# Patient Record
Sex: Female | Born: 1960 | Race: White | Hispanic: No | Marital: Married
Health system: Southern US, Community
[De-identification: ages and names within clinical notes are randomized; demographics above are authoritative.]

## PROBLEM LIST (undated history)

## (undated) HISTORY — PX: BREAST EXCISIONAL BIOPSY: SUR124

---

## 1999-02-15 ENCOUNTER — Other Ambulatory Visit: Admission: RE | Admit: 1999-02-15 | Discharge: 1999-02-15 | Payer: Self-pay | Admitting: Obstetrics and Gynecology

## 1999-09-30 ENCOUNTER — Encounter: Payer: Self-pay | Admitting: Obstetrics and Gynecology

## 1999-09-30 ENCOUNTER — Encounter: Admission: RE | Admit: 1999-09-30 | Discharge: 1999-09-30 | Payer: Self-pay | Admitting: Obstetrics and Gynecology

## 2000-03-16 ENCOUNTER — Other Ambulatory Visit: Admission: RE | Admit: 2000-03-16 | Discharge: 2000-03-16 | Payer: Self-pay | Admitting: Obstetrics and Gynecology

## 2000-10-17 ENCOUNTER — Encounter: Payer: Self-pay | Admitting: Obstetrics and Gynecology

## 2000-10-17 ENCOUNTER — Encounter: Admission: RE | Admit: 2000-10-17 | Discharge: 2000-10-17 | Payer: Self-pay | Admitting: Obstetrics and Gynecology

## 2001-04-24 ENCOUNTER — Other Ambulatory Visit: Admission: RE | Admit: 2001-04-24 | Discharge: 2001-04-24 | Payer: Self-pay | Admitting: Obstetrics and Gynecology

## 2001-10-24 ENCOUNTER — Encounter: Admission: RE | Admit: 2001-10-24 | Discharge: 2001-10-24 | Payer: Self-pay | Admitting: Obstetrics and Gynecology

## 2001-10-24 ENCOUNTER — Encounter: Payer: Self-pay | Admitting: Obstetrics and Gynecology

## 2001-10-24 ENCOUNTER — Encounter: Payer: Self-pay | Admitting: Family Medicine

## 2001-10-24 ENCOUNTER — Encounter: Admission: RE | Admit: 2001-10-24 | Discharge: 2001-10-24 | Payer: Self-pay | Admitting: Family Medicine

## 2002-05-16 ENCOUNTER — Other Ambulatory Visit: Admission: RE | Admit: 2002-05-16 | Discharge: 2002-05-16 | Payer: Self-pay | Admitting: Obstetrics and Gynecology

## 2002-11-07 ENCOUNTER — Encounter: Admission: RE | Admit: 2002-11-07 | Discharge: 2002-11-07 | Payer: Self-pay | Admitting: Obstetrics and Gynecology

## 2002-11-07 ENCOUNTER — Encounter: Payer: Self-pay | Admitting: Obstetrics and Gynecology

## 2003-06-19 ENCOUNTER — Other Ambulatory Visit: Admission: RE | Admit: 2003-06-19 | Discharge: 2003-06-19 | Payer: Self-pay | Admitting: Obstetrics and Gynecology

## 2003-12-04 ENCOUNTER — Encounter: Admission: RE | Admit: 2003-12-04 | Discharge: 2003-12-04 | Payer: Self-pay | Admitting: Obstetrics and Gynecology

## 2004-02-16 ENCOUNTER — Encounter (INDEPENDENT_AMBULATORY_CARE_PROVIDER_SITE_OTHER): Payer: Self-pay | Admitting: *Deleted

## 2004-02-16 ENCOUNTER — Inpatient Hospital Stay (HOSPITAL_COMMUNITY): Admission: RE | Admit: 2004-02-16 | Discharge: 2004-02-18 | Payer: Self-pay | Admitting: Obstetrics and Gynecology

## 2004-12-16 ENCOUNTER — Encounter: Admission: RE | Admit: 2004-12-16 | Discharge: 2004-12-16 | Payer: Self-pay | Admitting: Obstetrics and Gynecology

## 2004-12-22 ENCOUNTER — Encounter: Admission: RE | Admit: 2004-12-22 | Discharge: 2004-12-22 | Payer: Self-pay | Admitting: Obstetrics and Gynecology

## 2005-05-12 ENCOUNTER — Other Ambulatory Visit: Admission: RE | Admit: 2005-05-12 | Discharge: 2005-05-12 | Payer: Self-pay | Admitting: Obstetrics and Gynecology

## 2006-01-05 ENCOUNTER — Encounter: Admission: RE | Admit: 2006-01-05 | Discharge: 2006-01-05 | Payer: Self-pay | Admitting: Obstetrics and Gynecology

## 2007-01-18 ENCOUNTER — Encounter: Admission: RE | Admit: 2007-01-18 | Discharge: 2007-01-18 | Payer: Self-pay | Admitting: Obstetrics and Gynecology

## 2008-02-07 ENCOUNTER — Encounter: Admission: RE | Admit: 2008-02-07 | Discharge: 2008-02-07 | Payer: Self-pay | Admitting: Obstetrics and Gynecology

## 2008-05-07 ENCOUNTER — Encounter: Admission: RE | Admit: 2008-05-07 | Discharge: 2008-05-07 | Payer: Self-pay | Admitting: Obstetrics and Gynecology

## 2009-02-08 ENCOUNTER — Encounter: Admission: RE | Admit: 2009-02-08 | Discharge: 2009-02-08 | Payer: Self-pay | Admitting: Obstetrics and Gynecology

## 2010-02-10 ENCOUNTER — Encounter: Admission: RE | Admit: 2010-02-10 | Discharge: 2010-02-10 | Payer: Self-pay | Admitting: Obstetrics and Gynecology

## 2010-07-24 ENCOUNTER — Encounter: Payer: Self-pay | Admitting: Obstetrics and Gynecology

## 2010-11-18 NOTE — Discharge Summary (Signed)
NAME:  Regina Yu, DORMAN                        ACCOUNT NO.:  [id number]   MEDICAL RECORD NO.:  [id number]                   PATIENT TYPE:  INP   LOCATION:  9313                                 FACILITY:  [facility]   PHYSICIAN:  Michelle L. Vincente Poli, M.D.            DATE OF BIRTH:  Jun 12, 1961   DATE OF ADMISSION:  02/16/2004  DATE OF DISCHARGE:  02/18/2004                                 DISCHARGE SUMMARY   PREOPERATIVE DIAGNOSIS:  Pelvic relaxation.   POSTOPERATIVE DIAGNOSIS:  Pelvic relaxation.   HOSPITAL COURSE:  The patient is a 50 year old female who is admitted with  uterine prolapse, cystocele, and rectocele which are symptomatic.  On the  day of admission she undergoes a TVH, A&P repair.  By postoperative day #1  she was doing very well.  She had some discomfort and some pressure and  hemoglobin was 10.7 and white blood cell count was 12.6.  She was advanced  to a regular diet.  She was ambulating and voiding.  She was also given sitz  baths for local pain control and swelling.  By postoperative day #2 she was  doing well enough to go home.  She was in good condition.  She was  discharged home on ibuprofen and Tylox.  She had already had a bowel  movement.  She will follow up in 1 week in the office for a checkup and was  advised no intercourse for 6 weeks.  Advised to call if she has any fever,  temperature greater than 100.5, vaginal bleeding, nausea, vomiting, or  abdominal pain.  She was advised no driving for 1 week.                                               Michelle L. Vincente Poli, M.D.    Florestine Avers  D:  03/10/2004  T:  03/10/2004  Job:  [id number]

## 2010-11-18 NOTE — Op Note (Signed)
NAME:  Regina Yu, Regina Yu                        ACCOUNT NO.:  [id number]   MEDICAL RECORD NO.:  [id number]                   PATIENT TYPE:  INP   LOCATION:  9313                                 FACILITY:  [facility]   PHYSICIAN:  Michelle L. Vincente Poli, M.D.            DATE OF BIRTH:  [birth year]   DATE OF PROCEDURE:  02/16/2004  DATE OF DISCHARGE:                                 OPERATIVE REPORT   PREOPERATIVE DIAGNOSES:  1. Uterine prolapse.  2. Cystocele.  3. Rectocele.   POSTOPERATIVE DIAGNOSES:  1. Uterine prolapse.  2. Cystocele.  3. Rectocele.   OPERATION PERFORMED:  1. Total vaginal hysterectomy.  2. Anterior repair.  3. Posterior repair.   SURGEON:  Michelle L. Vincente Poli, M.D.   ASSISTANT:  Duke Salvia. Marcelle Overlie, M.D.   ANESTHESIA:  General.   ESTIMATED BLOOD LOSS:  100 mL.   FINDINGS:  There is a grade 2 to 3 cervical uterine prolapse and grade 2 to  3 cystocele and grade 2 to 3 rectocele.   DESCRIPTION OF PROCEDURE:  The patient was taken to the operating room.  She  was intubated without difficulty.  She was prepped and draped in the usual  sterile fashion and a Foley catheter was inserted into the bladder.  A  speculum was inserted into the vagina.  The cervix was grasped with a  tenaculum and a circumferential incision was made around the cervix.  The  posterior cul-de-sac was entered using Mayo scissors.  The anterior cul-de-  sac was then entered using Metzenbaum scissors.  We then used curved Heaney  clamps to clamp just the uterosacral cardinal ligament complexes on either  side being careful to stay just beside the cervix.  Each pedicle was cut and  suture ligated using 0 Vicryl suture.  Using curved Heaney clamp, we worked  our way up along just beside the uterus along the broad ligament. Each  pedicle was clamped, suture ligated using 0 Vicryl suture.  As we reached  the level of the triple pedicle, the uterus was retroflexed and curved  Heaney clamps were placed  across the triple pedicle.  We then noticed that  there was a Hulka clip on the patient's right side, which was removed as  well.  Each pedicle was cut and suture ligated using 0 Vicryl suture and  then further secured using a free tie of 0 Vicryl suture.  All pedicles were  inspected.  Hemostasis was noted. The posterior two thirds of the cuff was  closed in a continuous running locked stitch using __________ . We then  turned our attention anteriorly where Allis clamps were placed at each side  of the vaginal apex and using Metzenbaum scissors, we then dissected the  vesicovaginal fascia free from the overlying vaginal epithelium and made a  midline incision all the way up to the UV junction.  We then nicely reduced  the cystocele using sharp and blunt  dissection and it was further reduced  using pursestring sutures using 2-0 Vicryl suture times three. After the  cystocele was reduced, the redundant vaginal epithelium was trimmed and the  vagina was then closed using 2-0 Vicryl in continuous running locked stitch.  It was closed all the way to where the cuff closure was started.  Hemostasis  again was excellent.  We then turned our attention posteriorly where there  was a grade 2 to 3 rectocele noted.  It was all the way up to almost through  the posterior vaginal cuff.  We then placed Allis clamps at 5 and 7 o'clock  and a V-shaped incision was made in the perineum.  We then made a midline  incision in the vaginal epithelium all the way up the vaginal cuff and  dissected the rectovaginal fascia free from the overlying vaginal  epithelium.  The rectocele was reduced nicely and then it was closed using  interrupted 0 Vicryl sutures distally starting distally and moving  proximally.  We then trimmed the redundant vaginal epithelium and closed the  vaginal incision using 2-0 Vicryl in a continuous locking fashion.  The  perineum was also closed in subcuticular fashion using the same 2-0  Vicryl.  At the end of the procedure, hemostasis was excellent.  All sponge, lap, and  instruments were all correct times two.  The Foley catheter was draining  clear urine and a vaginal packing soaked in Estrace cream was placed in the  vagina.  The patient was extubated and went to recovery room in stable  condition.   PATHOLOGY:  Uterus and cervix.                                               Michelle L. Vincente Poli, M.D.    Florestine Avers  D:  02/16/2004  T:  02/17/2004  Job:  [id number]

## 2011-01-17 ENCOUNTER — Other Ambulatory Visit: Payer: Self-pay | Admitting: Obstetrics and Gynecology

## 2011-01-17 DIAGNOSIS — Z1231 Encounter for screening mammogram for malignant neoplasm of breast: Secondary | ICD-10-CM

## 2011-02-17 ENCOUNTER — Ambulatory Visit
Admission: RE | Admit: 2011-02-17 | Discharge: 2011-02-17 | Disposition: A | Discharge status: Home / Self Care | Payer: BC Managed Care – PPO | Source: Ambulatory Visit | Attending: Obstetrics and Gynecology | Admitting: Obstetrics and Gynecology

## 2011-02-17 DIAGNOSIS — Z1231 Encounter for screening mammogram for malignant neoplasm of breast: Secondary | ICD-10-CM

## 2012-01-09 ENCOUNTER — Other Ambulatory Visit: Payer: Self-pay | Admitting: Obstetrics and Gynecology

## 2012-01-09 DIAGNOSIS — Z1231 Encounter for screening mammogram for malignant neoplasm of breast: Secondary | ICD-10-CM

## 2012-01-09 DIAGNOSIS — M8580 Other specified disorders of bone density and structure, unspecified site: Secondary | ICD-10-CM

## 2012-01-23 ENCOUNTER — Other Ambulatory Visit: Payer: BC Managed Care – PPO

## 2012-02-23 ENCOUNTER — Ambulatory Visit
Admission: RE | Admit: 2012-02-23 | Discharge: 2012-02-23 | Disposition: A | Discharge status: Home / Self Care | Payer: BC Managed Care – PPO | Source: Ambulatory Visit | Attending: Obstetrics and Gynecology | Admitting: Obstetrics and Gynecology

## 2012-02-23 DIAGNOSIS — Z1231 Encounter for screening mammogram for malignant neoplasm of breast: Secondary | ICD-10-CM

## 2012-02-23 DIAGNOSIS — M8580 Other specified disorders of bone density and structure, unspecified site: Secondary | ICD-10-CM

## 2012-06-20 ENCOUNTER — Other Ambulatory Visit: Payer: Self-pay | Admitting: Obstetrics and Gynecology

## 2012-12-19 ENCOUNTER — Other Ambulatory Visit: Payer: Self-pay

## 2012-12-19 DIAGNOSIS — Z1231 Encounter for screening mammogram for malignant neoplasm of breast: Secondary | ICD-10-CM

## 2013-02-26 ENCOUNTER — Ambulatory Visit
Admission: RE | Admit: 2013-02-26 | Discharge: 2013-02-26 | Disposition: A | Discharge status: Home / Self Care | Payer: BC Managed Care – PPO | Source: Ambulatory Visit

## 2013-02-26 DIAGNOSIS — Z1231 Encounter for screening mammogram for malignant neoplasm of breast: Secondary | ICD-10-CM

## 2013-02-28 ENCOUNTER — Ambulatory Visit: Payer: BC Managed Care – PPO

## 2013-07-24 ENCOUNTER — Other Ambulatory Visit: Payer: Self-pay | Admitting: Obstetrics and Gynecology

## 2014-01-06 ENCOUNTER — Other Ambulatory Visit: Payer: Self-pay

## 2014-02-03 ENCOUNTER — Other Ambulatory Visit: Payer: Self-pay | Admitting: Obstetrics and Gynecology

## 2014-02-03 DIAGNOSIS — Z1231 Encounter for screening mammogram for malignant neoplasm of breast: Secondary | ICD-10-CM

## 2014-02-03 DIAGNOSIS — M8580 Other specified disorders of bone density and structure, unspecified site: Secondary | ICD-10-CM

## 2014-03-26 ENCOUNTER — Ambulatory Visit
Admission: RE | Admit: 2014-03-26 | Discharge: 2014-03-26 | Disposition: A | Discharge status: Home / Self Care | Payer: Commercial Managed Care - PPO | Source: Ambulatory Visit | Attending: Obstetrics and Gynecology | Admitting: Obstetrics and Gynecology

## 2014-03-26 ENCOUNTER — Encounter (INDEPENDENT_AMBULATORY_CARE_PROVIDER_SITE_OTHER): Payer: Self-pay

## 2014-03-26 DIAGNOSIS — Z1231 Encounter for screening mammogram for malignant neoplasm of breast: Secondary | ICD-10-CM

## 2014-03-26 DIAGNOSIS — M8580 Other specified disorders of bone density and structure, unspecified site: Secondary | ICD-10-CM

## 2014-11-05 ENCOUNTER — Other Ambulatory Visit (HOSPITAL_COMMUNITY): Payer: Self-pay | Admitting: Obstetrics and Gynecology

## 2014-11-06 LAB — CYTOLOGY - PAP

## 2014-11-26 ENCOUNTER — Other Ambulatory Visit: Payer: Self-pay | Admitting: Obstetrics and Gynecology

## 2015-03-01 ENCOUNTER — Other Ambulatory Visit: Payer: Self-pay

## 2015-03-01 DIAGNOSIS — Z1231 Encounter for screening mammogram for malignant neoplasm of breast: Secondary | ICD-10-CM

## 2015-04-08 ENCOUNTER — Ambulatory Visit
Admission: RE | Admit: 2015-04-08 | Discharge: 2015-04-08 | Disposition: A | Discharge status: Home / Self Care | Payer: Commercial Managed Care - PPO | Source: Ambulatory Visit

## 2015-04-08 DIAGNOSIS — Z1231 Encounter for screening mammogram for malignant neoplasm of breast: Secondary | ICD-10-CM

## 2015-09-27 ENCOUNTER — Other Ambulatory Visit: Payer: Self-pay | Admitting: Obstetrics and Gynecology

## 2015-09-27 DIAGNOSIS — M8580 Other specified disorders of bone density and structure, unspecified site: Secondary | ICD-10-CM

## 2015-09-27 DIAGNOSIS — Z780 Asymptomatic menopausal state: Secondary | ICD-10-CM

## 2015-11-10 ENCOUNTER — Ambulatory Visit
Admission: RE | Admit: 2015-11-10 | Discharge: 2015-11-10 | Disposition: A | Discharge status: Home / Self Care | Payer: Commercial Managed Care - PPO | Source: Ambulatory Visit | Attending: Obstetrics and Gynecology | Admitting: Obstetrics and Gynecology

## 2015-11-10 DIAGNOSIS — M8580 Other specified disorders of bone density and structure, unspecified site: Secondary | ICD-10-CM

## 2015-11-10 DIAGNOSIS — Z780 Asymptomatic menopausal state: Secondary | ICD-10-CM

## 2016-03-01 ENCOUNTER — Other Ambulatory Visit: Payer: Self-pay | Admitting: Obstetrics and Gynecology

## 2016-03-01 DIAGNOSIS — Z1231 Encounter for screening mammogram for malignant neoplasm of breast: Secondary | ICD-10-CM

## 2016-04-13 ENCOUNTER — Ambulatory Visit
Admission: RE | Admit: 2016-04-13 | Discharge: 2016-04-13 | Disposition: A | Discharge status: Home / Self Care | Payer: Commercial Managed Care - PPO | Source: Ambulatory Visit | Attending: Obstetrics and Gynecology | Admitting: Obstetrics and Gynecology

## 2016-04-13 DIAGNOSIS — Z1231 Encounter for screening mammogram for malignant neoplasm of breast: Secondary | ICD-10-CM

## 2017-03-08 ENCOUNTER — Other Ambulatory Visit: Payer: Self-pay | Admitting: Obstetrics and Gynecology

## 2017-03-08 DIAGNOSIS — Z1231 Encounter for screening mammogram for malignant neoplasm of breast: Secondary | ICD-10-CM

## 2017-04-19 ENCOUNTER — Ambulatory Visit
Admission: RE | Admit: 2017-04-19 | Discharge: 2017-04-19 | Disposition: A | Discharge status: Home / Self Care | Payer: Commercial Managed Care - PPO | Source: Ambulatory Visit | Attending: Obstetrics and Gynecology | Admitting: Obstetrics and Gynecology

## 2017-04-19 DIAGNOSIS — Z1231 Encounter for screening mammogram for malignant neoplasm of breast: Secondary | ICD-10-CM

## 2018-03-15 ENCOUNTER — Other Ambulatory Visit: Payer: Self-pay | Admitting: Obstetrics and Gynecology

## 2018-03-15 DIAGNOSIS — Z1231 Encounter for screening mammogram for malignant neoplasm of breast: Secondary | ICD-10-CM

## 2018-03-15 DIAGNOSIS — E2839 Other primary ovarian failure: Secondary | ICD-10-CM

## 2018-04-25 ENCOUNTER — Other Ambulatory Visit: Payer: Self-pay | Admitting: Obstetrics and Gynecology

## 2018-04-25 DIAGNOSIS — Z780 Asymptomatic menopausal state: Secondary | ICD-10-CM

## 2018-04-25 DIAGNOSIS — M8580 Other specified disorders of bone density and structure, unspecified site: Secondary | ICD-10-CM

## 2018-04-26 ENCOUNTER — Ambulatory Visit
Admission: RE | Admit: 2018-04-26 | Discharge: 2018-04-26 | Disposition: A | Discharge status: Home / Self Care | Payer: Commercial Managed Care - PPO | Source: Ambulatory Visit | Attending: Obstetrics and Gynecology | Admitting: Obstetrics and Gynecology

## 2018-04-26 DIAGNOSIS — M8580 Other specified disorders of bone density and structure, unspecified site: Secondary | ICD-10-CM

## 2018-04-26 DIAGNOSIS — Z1231 Encounter for screening mammogram for malignant neoplasm of breast: Secondary | ICD-10-CM

## 2018-04-26 DIAGNOSIS — Z780 Asymptomatic menopausal state: Secondary | ICD-10-CM

## 2019-03-13 ENCOUNTER — Other Ambulatory Visit: Payer: Self-pay | Admitting: Obstetrics and Gynecology

## 2019-03-13 DIAGNOSIS — Z1231 Encounter for screening mammogram for malignant neoplasm of breast: Secondary | ICD-10-CM

## 2019-05-14 ENCOUNTER — Ambulatory Visit
Admission: RE | Admit: 2019-05-14 | Discharge: 2019-05-14 | Disposition: A | Discharge status: Home / Self Care | Payer: Commercial Managed Care - PPO | Source: Ambulatory Visit | Attending: Obstetrics and Gynecology | Admitting: Obstetrics and Gynecology

## 2019-05-14 ENCOUNTER — Other Ambulatory Visit: Payer: Self-pay

## 2019-05-14 DIAGNOSIS — Z1231 Encounter for screening mammogram for malignant neoplasm of breast: Secondary | ICD-10-CM

## 2019-05-15 ENCOUNTER — Ambulatory Visit: Payer: Commercial Managed Care - PPO

## 2020-03-31 ENCOUNTER — Other Ambulatory Visit: Payer: Self-pay | Admitting: Obstetrics and Gynecology

## 2020-03-31 DIAGNOSIS — R5381 Other malaise: Secondary | ICD-10-CM

## 2020-04-02 ENCOUNTER — Other Ambulatory Visit: Payer: Self-pay | Admitting: Obstetrics and Gynecology

## 2020-04-02 DIAGNOSIS — Z1231 Encounter for screening mammogram for malignant neoplasm of breast: Secondary | ICD-10-CM

## 2020-04-13 ENCOUNTER — Other Ambulatory Visit: Payer: Self-pay | Admitting: Obstetrics and Gynecology

## 2020-04-13 DIAGNOSIS — M810 Age-related osteoporosis without current pathological fracture: Secondary | ICD-10-CM

## 2020-05-14 ENCOUNTER — Ambulatory Visit
Admission: RE | Admit: 2020-05-14 | Discharge: 2020-05-14 | Disposition: A | Discharge status: Home / Self Care | Payer: Commercial Managed Care - PPO | Source: Ambulatory Visit | Attending: Obstetrics and Gynecology | Admitting: Obstetrics and Gynecology

## 2020-05-14 ENCOUNTER — Other Ambulatory Visit: Payer: Self-pay

## 2020-05-14 DIAGNOSIS — M810 Age-related osteoporosis without current pathological fracture: Secondary | ICD-10-CM

## 2020-05-14 DIAGNOSIS — Z1231 Encounter for screening mammogram for malignant neoplasm of breast: Secondary | ICD-10-CM

## 2020-07-01 ENCOUNTER — Other Ambulatory Visit: Payer: Commercial Managed Care - PPO

## 2021-03-01 ENCOUNTER — Other Ambulatory Visit: Payer: Self-pay | Admitting: Obstetrics and Gynecology

## 2021-03-01 DIAGNOSIS — Z1231 Encounter for screening mammogram for malignant neoplasm of breast: Secondary | ICD-10-CM

## 2021-05-23 ENCOUNTER — Ambulatory Visit
Admission: RE | Admit: 2021-05-23 | Discharge: 2021-05-23 | Disposition: A | Discharge status: Home / Self Care | Payer: Commercial Managed Care - PPO | Source: Ambulatory Visit | Attending: Obstetrics and Gynecology | Admitting: Obstetrics and Gynecology

## 2021-05-23 DIAGNOSIS — Z1231 Encounter for screening mammogram for malignant neoplasm of breast: Secondary | ICD-10-CM

## 2022-01-09 ENCOUNTER — Other Ambulatory Visit: Payer: Self-pay | Admitting: Obstetrics and Gynecology

## 2022-01-09 DIAGNOSIS — M8580 Other specified disorders of bone density and structure, unspecified site: Secondary | ICD-10-CM

## 2022-01-09 DIAGNOSIS — Z1231 Encounter for screening mammogram for malignant neoplasm of breast: Secondary | ICD-10-CM

## 2022-05-24 ENCOUNTER — Ambulatory Visit
Admission: RE | Admit: 2022-05-24 | Discharge: 2022-05-24 | Disposition: A | Discharge status: Home / Self Care | Payer: Commercial Managed Care - PPO | Source: Ambulatory Visit | Attending: Obstetrics and Gynecology | Admitting: Obstetrics and Gynecology

## 2022-05-24 DIAGNOSIS — Z1231 Encounter for screening mammogram for malignant neoplasm of breast: Secondary | ICD-10-CM

## 2022-06-20 ENCOUNTER — Ambulatory Visit
Admission: RE | Admit: 2022-06-20 | Discharge: 2022-06-20 | Disposition: A | Discharge status: Home / Self Care | Payer: Commercial Managed Care - PPO | Source: Ambulatory Visit | Attending: Obstetrics and Gynecology | Admitting: Obstetrics and Gynecology

## 2022-06-20 DIAGNOSIS — M8580 Other specified disorders of bone density and structure, unspecified site: Secondary | ICD-10-CM

## 2022-07-18 IMAGING — MG MM DIGITAL SCREENING BILAT W/ TOMO AND CAD
8 series · 8 of 24 positions shown · non-contrast
Comparison: Previous exam(s).

CLINICAL DATA: Screening.

EXAM:
DIGITAL SCREENING BILATERAL MAMMOGRAM WITH TOMOSYNTHESIS AND CAD
TECHNIQUE: Bilateral screening digital craniocaudal and mediolateral oblique
mammograms were obtained. Bilateral screening digital breast
tomosynthesis was performed. The images were evaluated with
computer-aided detection.

[R CC synth-2D]
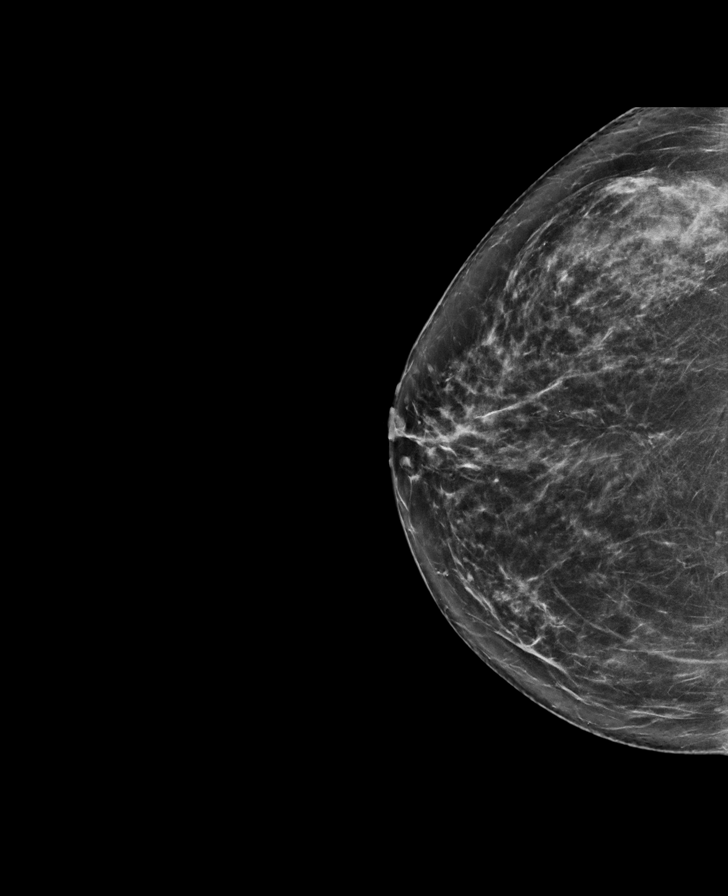

[L MLO synth-2D]
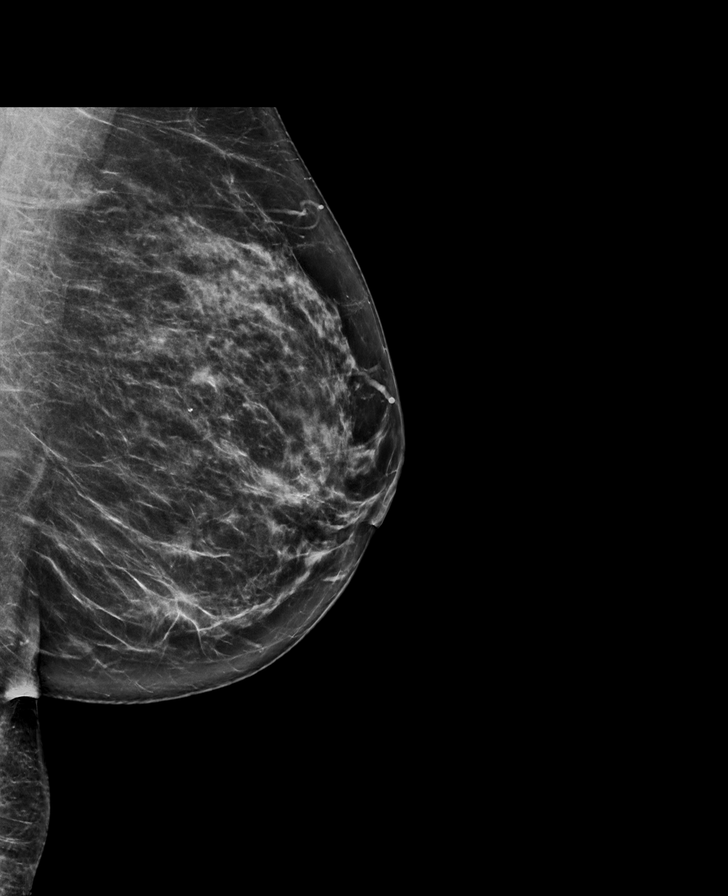

[L CC synth-2D]
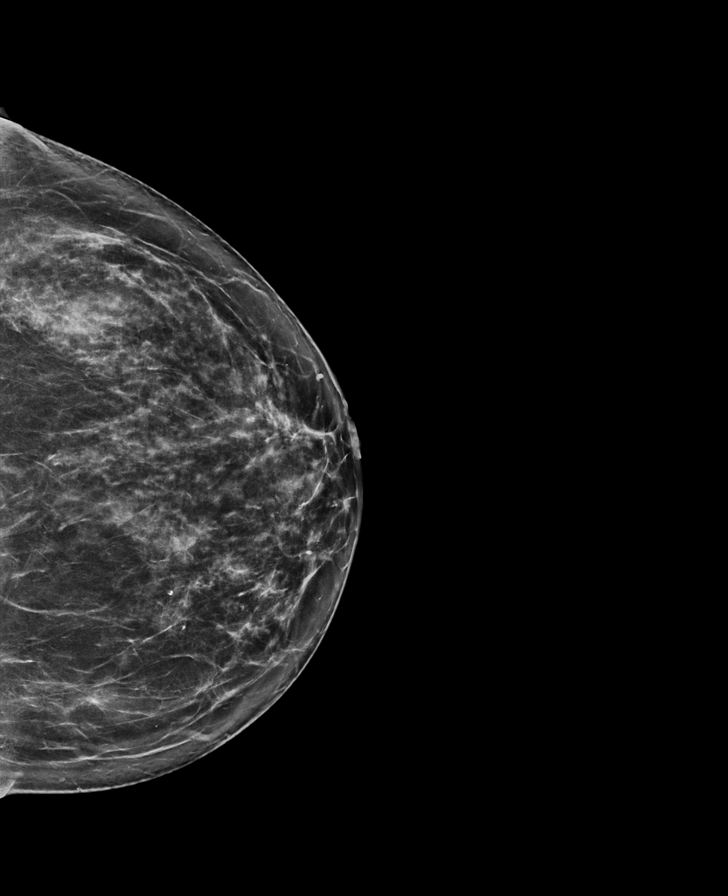

[R MLO synth-2D]
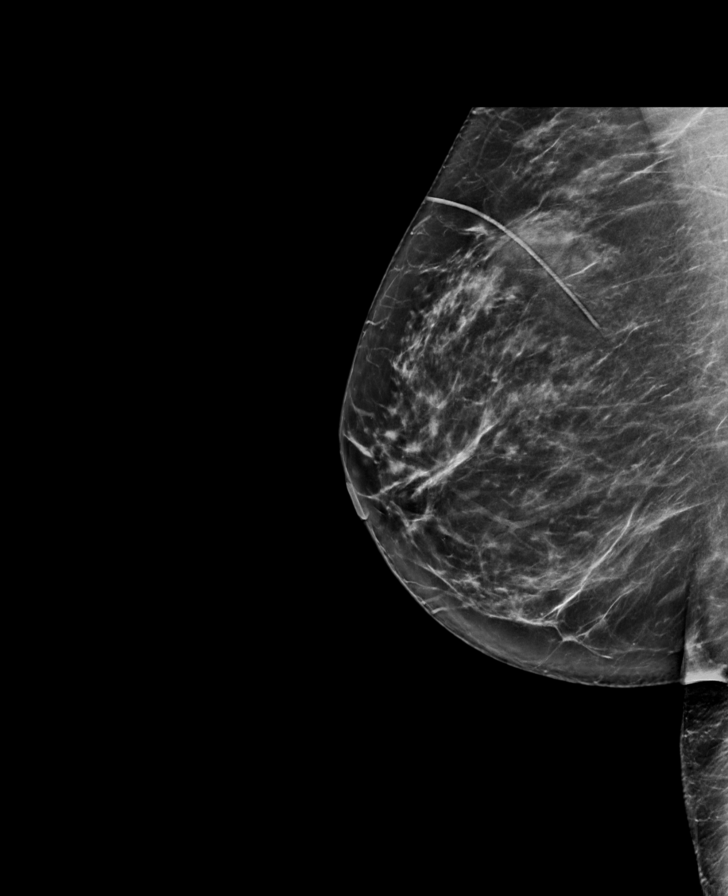

[L MLO tomo · tomo slice 39/78.0]
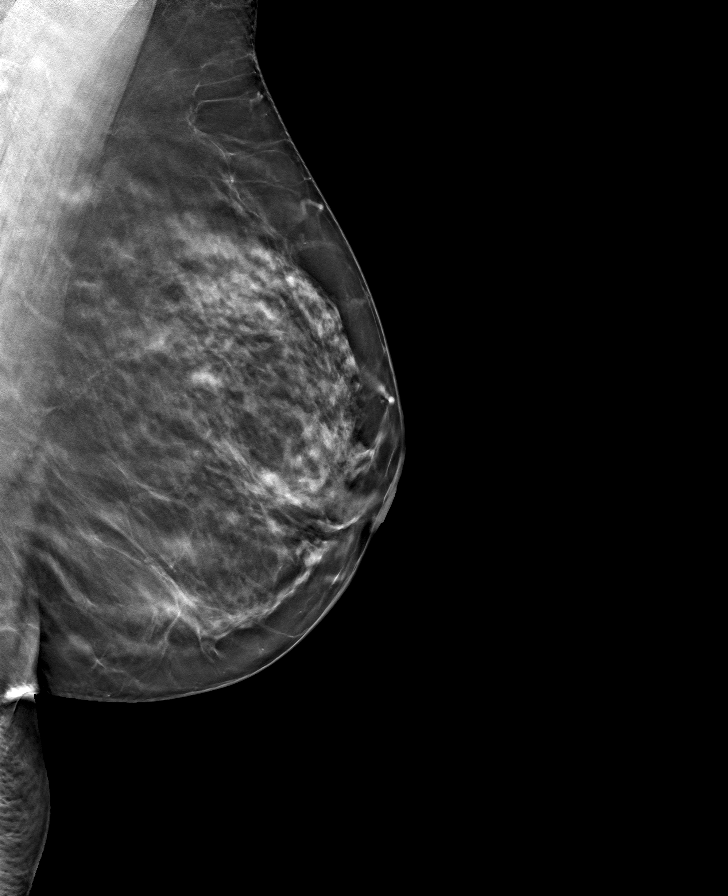

[R CC tomo · tomo slice 37/73.0]
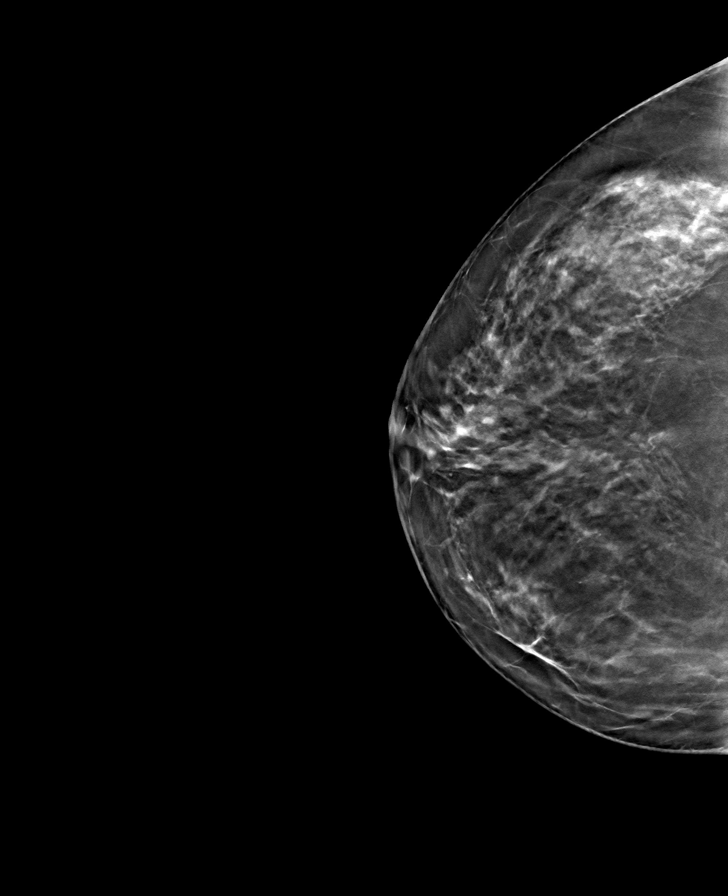

[L CC tomo · tomo slice 38/75.0]
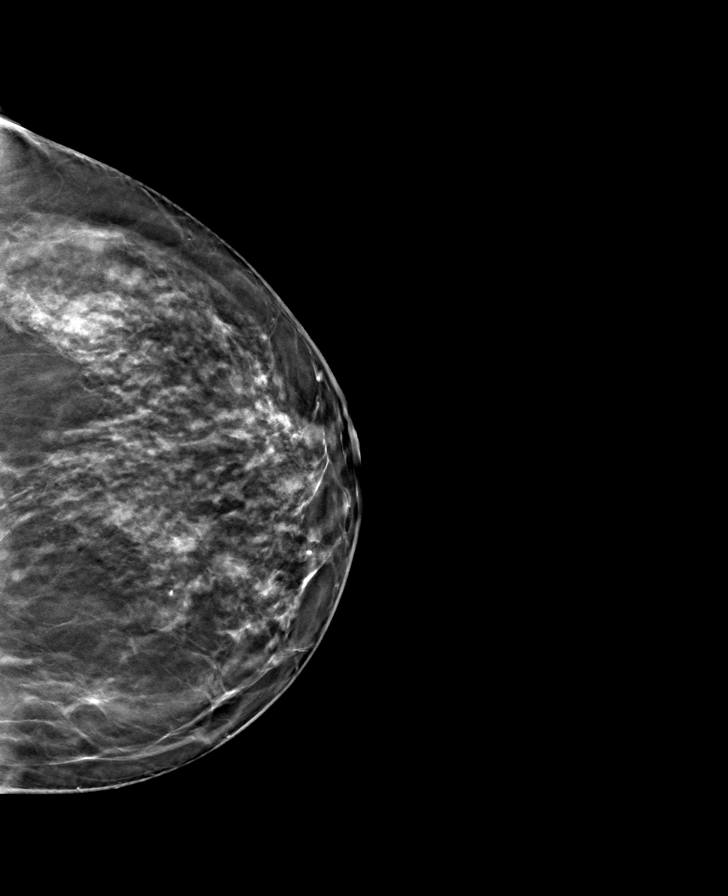

[R MLO tomo · tomo slice 40/79.0]
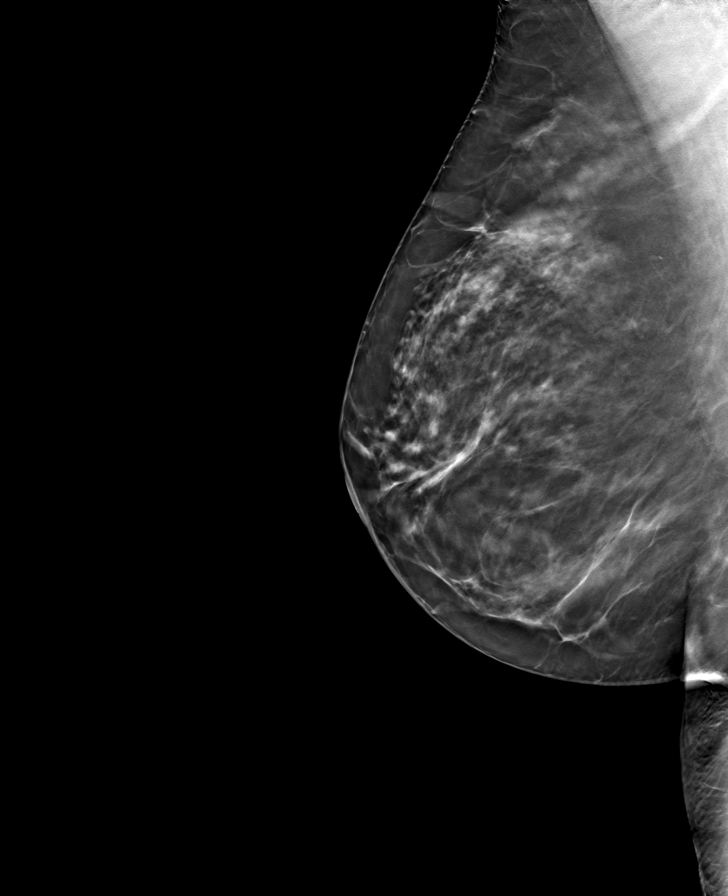

[8 of 24 positions shown; findings below may reference images not displayed]

ACR Breast Density Category b: There are scattered areas of
fibroglandular density.
FINDINGS: There are no findings suspicious for malignancy.
IMPRESSION: No mammographic evidence of malignancy. A result letter of this
screening mammogram will be mailed directly to the patient.

RECOMMENDATION:
Screening mammogram in one year. (Code:51-O-LD2)

BI-RADS CATEGORY  1: Negative.

## 2023-01-03 ENCOUNTER — Other Ambulatory Visit: Payer: Self-pay | Admitting: Obstetrics and Gynecology

## 2023-01-03 DIAGNOSIS — Z1231 Encounter for screening mammogram for malignant neoplasm of breast: Secondary | ICD-10-CM

## 2023-05-28 ENCOUNTER — Ambulatory Visit
Admission: RE | Admit: 2023-05-28 | Discharge: 2023-05-28 | Disposition: A | Discharge status: Home / Self Care | Payer: BC Managed Care – PPO | Source: Ambulatory Visit | Attending: Obstetrics and Gynecology | Admitting: Obstetrics and Gynecology

## 2023-05-28 DIAGNOSIS — Z1231 Encounter for screening mammogram for malignant neoplasm of breast: Secondary | ICD-10-CM

## 2024-02-07 ENCOUNTER — Other Ambulatory Visit: Payer: Self-pay | Admitting: Obstetrics and Gynecology

## 2024-02-07 DIAGNOSIS — Z1231 Encounter for screening mammogram for malignant neoplasm of breast: Secondary | ICD-10-CM

## 2024-05-28 ENCOUNTER — Ambulatory Visit
Admission: RE | Admit: 2024-05-28 | Discharge: 2024-05-28 | Disposition: A | Discharge status: Home / Self Care | Source: Ambulatory Visit | Attending: Obstetrics and Gynecology | Admitting: Obstetrics and Gynecology

## 2024-05-28 DIAGNOSIS — Z1231 Encounter for screening mammogram for malignant neoplasm of breast: Secondary | ICD-10-CM
